# Patient Record
Sex: Female | Born: 1995 | Race: White | Hispanic: No | Marital: Married | Smoking: Never smoker
Health system: Southern US, Community
[De-identification: ages and names within clinical notes are randomized; demographics above are authoritative.]

## PROBLEM LIST (undated history)

## (undated) DIAGNOSIS — G43909 Migraine, unspecified, not intractable, without status migrainosus: Secondary | ICD-10-CM

## (undated) HISTORY — PX: INNER EAR SURGERY: SHX679

---

## 2015-01-25 ENCOUNTER — Emergency Department (HOSPITAL_BASED_OUTPATIENT_CLINIC_OR_DEPARTMENT_OTHER)
Admission: EM | Admit: 2015-01-25 | Discharge: 2015-01-25 | Disposition: A | Discharge status: Home / Self Care | Payer: Medicaid Other | Attending: Emergency Medicine | Admitting: Emergency Medicine

## 2015-01-25 ENCOUNTER — Encounter (HOSPITAL_BASED_OUTPATIENT_CLINIC_OR_DEPARTMENT_OTHER): Payer: Self-pay | Admitting: Emergency Medicine

## 2015-01-25 DIAGNOSIS — H9201 Otalgia, right ear: Secondary | ICD-10-CM | POA: Diagnosis present

## 2015-01-25 DIAGNOSIS — R51 Headache: Secondary | ICD-10-CM | POA: Insufficient documentation

## 2015-01-25 DIAGNOSIS — R519 Headache, unspecified: Secondary | ICD-10-CM

## 2015-01-25 MED ORDER — METHYLPREDNISOLONE SODIUM SUCC 125 MG IJ SOLR
125.0000 mg | Freq: Every day | INTRAMUSCULAR | Status: DC
  Administered 2015-01-25: 125 mg via INTRAVENOUS
  Filled 2015-01-25: qty 2

## 2015-01-25 MED ORDER — PROCHLORPERAZINE EDISYLATE 5 MG/ML IJ SOLN
10.0000 mg | Freq: Once | INTRAMUSCULAR | Status: AC
  Administered 2015-01-25: 10 mg via INTRAVENOUS
  Filled 2015-01-25: qty 2

## 2015-01-25 MED ORDER — PROCHLORPERAZINE MALEATE 10 MG PO TABS: 10.0000 mg | ORAL_TABLET | Freq: Two times a day (BID) | ORAL | Status: AC | PRN

## 2015-01-25 MED ORDER — DIPHENHYDRAMINE HCL 50 MG/ML IJ SOLN
25.0000 mg | Freq: Once | INTRAMUSCULAR | Status: AC
  Administered 2015-01-25: 25 mg via INTRAVENOUS
  Filled 2015-01-25: qty 1

## 2015-01-25 MED ORDER — MAGNESIUM SULFATE 2 GM/50ML IV SOLN
2.0000 g | Freq: Once | INTRAVENOUS | Status: AC
  Administered 2015-01-25: 2 g via INTRAVENOUS
  Filled 2015-01-25: qty 50

## 2015-01-25 MED ORDER — SODIUM CHLORIDE 0.9 % IV BOLUS (SEPSIS)
1000.0000 mL | Freq: Once | INTRAVENOUS | Status: AC
  Administered 2015-01-25: 1000 mL via INTRAVENOUS

## 2015-01-25 NOTE — ED Provider Notes (Signed)
CSN: [id number]     Arrival date & time 01/25/15  1500 History   First MD Initiated Contact with Patient 01/25/15 1552     Chief Complaint  Patient presents with  . Otalgia     (Consider location/radiation/quality/duration/timing/severity/associated sxs/prior Treatment) HPI   Blood pressure 113/61, pulse 69, temperature 98.4 F (36.9 C), temperature source Oral, resp. rate 18, height 5\' 4"  (1.626 m), weight 110 lb (49.896 kg), last menstrual period 01/08/2015, SpO2 100 %.  Leslie Short is a 19 y.o. female complaining of worsening right ear pain over the course of last several months. 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 She states that she has decreased hearing out of the ear and canal longer use a phone on the right side. She does also report an exacerbation of her chronic headaches with this, she had vertigo and a small amount of emesis this morning when the headache was very severe. She moved here and has not established ENT care. Patient denies fever, chills, change in vision.   History reviewed. No pertinent past medical history. Past Surgical History  Procedure Laterality Date  . Inner ear surgery Right    History reviewed. No pertinent family history. Social History  Substance Use Topics  . Smoking status: Never Smoker   . Smokeless tobacco: None  . Alcohol Use: No   OB History    No data available     Review of Systems  10 systems reviewed and found to be negative, except as noted in the HPI.  Allergies  Rocephin  Home Medications   Prior to Admission medications   Medication Sig Start Date End Date Taking? Authorizing Provider  prochlorperazine (COMPAZINE) 10 MG tablet Take 1 tablet (10 mg total) by mouth 2 (two) times daily as needed for nausea or vomiting (headache). 01/25/15   Trelon Plush, PA-C   BP 107/66 mmHg  Pulse 67  Temp(Src) 98.4 F (36.9 C) (Oral)  Resp  16  Ht 5\' 4"  (1.626 m)  Wt 110 lb (49.896 kg)  BMI 18.87 kg/m2  SpO2 100%  LMP 01/08/2015 (Approximate) Physical Exam  Constitutional: She is oriented to person, place, and time. She appears well-developed and well-nourished. No distress.  HENT:  Head: Normocephalic and atraumatic.  Mouth/Throat: Oropharynx is clear and moist.  Right outer ear canal normal, there does appear to be a small tympanic membrane rupture. No purulent discharge.  Eyes: Conjunctivae and EOM are normal. Pupils are equal, round, and reactive to light.  Neck: Normal range of motion.  Cardiovascular: Normal rate, regular rhythm and intact distal pulses.   Pulmonary/Chest: Effort normal and breath sounds normal.  Abdominal: Soft. There is no tenderness.  Musculoskeletal: Normal range of motion.  Neurological: She is alert and oriented to person, place, and time.  Skin: She is not diaphoretic.  Psychiatric: She has a normal mood and affect.  Nursing note and vitals reviewed.   ED Course  Procedures (including critical care time) Labs Review Labs Reviewed - No data to display  Imaging Review No results found. I have personally reviewed and evaluated these images and lab results as part of my medical decision-making.   EKG Interpretation None      MDM   Final diagnoses:  Otalgia, right  Nonintractable headache, unspecified chronicity pattern, unspecified headache type    Filed Vitals:   01/25/15 1507 01/25/15 1700 01/25/15 1730 01/25/15 1829  BP: 113/61  103/63 110/66 107/66  Pulse: 69 67 72 67  Temp: 98.4 F (36.9 C)     TempSrc: Oral     Resp: Height:  (1.626 m)     Weight: 110 lb (49.896 kg)     SpO2: 100% 99% 99% 100%    Medications  methylPREDNISolone sodium succinate (SOLU-MEDROL) 125 mg/2 mL injection 125 mg (125 mg Intravenous Given 01/25/15 1622)  sodium chloride 0.9 % bolus 1,000 mL (0 mLs Intravenous Stopped 01/25/15 1830)  prochlorperazine (COMPAZINE) injection 10  mg (10 mg Intravenous Given 01/25/15 1623)  diphenhydrAMINE (BENADRYL) injection 25 mg (25 mg Intravenous Given 01/25/15 1625)  magnesium sulfate IVPB 2 g 50 mL (0 g Intravenous Stopped 01/25/15 1716)    Leslie Short is a pleasant 19 y.o. female presenting with worsening right ear pain, believe that this is worsening her chronic headaches. Patient states that she felt dizzy and had nausea earlier in the day. We'll give her headache cocktail and reassess. I've explained to her that it critically important that she follow-up with ENT. She has a local ENT appointment I think with her history think it may be beneficial for her to follow at [facility], will give referral.   Reports improvement with headache cocktail, she is tolerating by mouth and able to ambulate without issue.  Evaluation does not show pathology that would require ongoing emergent intervention or inpatient treatment. Pt is hemodynamically stable and mentating appropriately. Discussed findings and plan with patient/guardian, who agrees with care plan. All questions answered. Return precautions discussed and outpatient follow up given.   Discharge Medication List as of 01/25/2015  6:24 PM    START taking these medications   Details  prochlorperazine (COMPAZINE) 10 MG tablet Take 1 tablet (10 mg total) by mouth 2 (two) times daily as needed for nausea or vomiting (headache)., Starting 01/25/2015, Until Discontinued, Print             Wynetta Emery, PA-C 01/25/15 1915  Geoffery Lyons, MD 01/25/15 [phone]

## 2015-01-25 NOTE — ED Notes (Signed)
Pt cont to be resting quietly, appears to be sleeping, safety measures in place

## 2015-01-25 NOTE — ED Notes (Signed)
Patient reports right ear pain x "a while ago".  Reports "around a month". Reports previously having 9 surgeries on this ear and states "its just really hurting lately".

## 2015-01-25 NOTE — ED Notes (Signed)
Presents with rt ear pain, states pain occurs frequently, this am pain present, had vertigo, unsteady gait, and nausea, sm amt of vomiting.

## 2015-01-25 NOTE — Discharge Instructions (Signed)
[facility] [phone]  Do not hesitate to return to the emergency room for any new, worsening or concerning symptoms.  Please obtain primary care using resource guide below. Let them know that you were seen in the emergency room and that they will need to obtain records for further outpatient management.    Emergency Department Resource Guide 1) Find a Doctor and Pay Out of Pocket Although you won't have to find out who is covered by your insurance plan, it is a good idea to ask around and get recommendations. You will then need to call the office and see if the doctor you have chosen will accept you as a new patient and what types of options they offer for patients who are self-pay. Some doctors offer discounts or will set up payment plans for their patients who do not have insurance, but you will need to ask so you aren't surprised when you get to your appointment.  2) Contact Your Local Health Department Not all health departments have doctors that can see patients for sick visits, but many do, so it is worth a call to see if yours does. If you don't know where your local health department is, you can check in your phone book. The CDC also has a tool to help you locate your state's health department, and many state websites also have listings of all of their local health departments.  3) Find a Walk-in Clinic If your illness is not likely to be very severe or complicated, you may want to try a walk in clinic. These are popping up all over the country in pharmacies, drugstores, and shopping centers. They're usually staffed by nurse practitioners or physician assistants that have been trained to treat common illnesses and complaints. They're usually fairly quick and inexpensive. However, if you have serious medical issues or chronic medical problems, these are probably not your best option.  No Primary Care Doctor: - Call Health Connect at  [phone] - they can help you  locate a primary care doctor that  accepts your insurance, provides certain services, etc. - Physician Referral Service- [phone]  Chronic Pain Problems: Organization         Address  Phone   Notes  [facility]  [phone] Patients need to be referred by their primary care doctor.   Medication Assistance: Organization         Address  Phone   Notes  [facility] Medication [facility] [address] [place] [place], [place] [address] [phone] --Must be a resident of [facility] -- Must have NO insurance coverage whatsoever (no Medicaid/ Medicare, etc.) -- The pt. MUST have a primary care doctor that directs their care regularly and follows them in the community   MedAssist  [phone]   [employer]  [phone]    Agencies that provide inexpensive medical care: Organization         Address  Phone   Notes  [facility]  [phone]   [facility]    [phone]   [facility] [address] [place], [place] [address] [phone]   [facility] [address] [place], [place] [phone]   Planned Parenthood    [phone]   [facility]    [phone]   Community Health and [facility]  [address], [place] Phone:  [phone], Fax:  [phone])  [phone] Hours of Operation:  9 am - 6 pm, M-F.  Also accepts Medicaid/Medicare and self-pay.  [facility] for [place], [address], [place] Phone: [phone], Fax: [phone]. Hours of Operation:  8:30 am - 5:30 pm, M-F.  Also accepts Medicaid and self-pay.  [facility] [place] [address], [place] Phone: [phone]   [place], [place], [place] [phone], Ext. [phone] Mondays & Thursdays: 7-9 AM.  First 15 patients are seen on a first come, first serve basis.    [place]  Providers:  Organization         Address  Phone   Notes  [facility] [address], [place] [phone] Also accepts self-pay patients.  [facility] [address] [place], [place]  [phone]   [place], [address], [place] [phone]   [facility] [address], [place] [phone]   Lucianne Lei [address], [place]   [phone] Only accepts [place] Access [place] patients after they have their name applied to their card.   Self-Pay (no insurance) in [facility]:  Organization         Address  Phone   Notes  Sickle Cell Patients, [facility] Internal Medicine [place] [phone]   [facility] [place] [phone]   [facility] [place]  [place], [place], [place] [phone]   [facility]/Dr. Osei-Bonsu  [address], [place] or [address], [place] [phone] Phone number for both [place] and [place] locations is the same.  Urgent Medical and [facility] [address] [place] [phone]   [facility] [address] [place] or [address] [phone]   [facility] [address], [place] [phone], phone; [phone], fax Sees patients 1st and 3rd Saturday of every month.  Must not qualify for public or private insurance (i.e. Medicaid, Medicare, [place] Health Choice, Veterans' Benefits)  Household income should be no more than 200% of the poverty level The clinic cannot treat you if you are pregnant or think you are pregnant  Sexually transmitted diseases are not treated at the clinic.    Dental Care: Organization         Address  Phone  Notes  [facility] [phone] Accepts children up to age 33 who are enrolled in [place] or [place]; pregnant women with a Medicaid card; and children who have applied for Medicaid or [place] Health Choice, but were declined, whose parents can pay a reduced fee at time of service.  [facility] Department of [facility]  [address], [place] [phone] Accepts children up to age 77 who are enrolled in [place] or [place]; pregnant women with a Medicaid card; and children who have applied for Medicaid or  Health Choice, but were declined, whose parents can pay a reduced fee at time of service.  [facility]  [place] [phone] Patients are seen by appointment only. Walk-ins are not accepted. [place]  will see patients 19 years of age and older. Monday - Tuesday (8am-5pm) Most Wednesdays (8:30-5pm) $30 per visit, cash only  [facility]  [address], [facility] [phone] Patients are seen by appointment only. Walk-ins are not accepted. [facility] will see patients 818 years of age and older. One Wednesday Evening (Monthly: Volunteer Based).  $30 per visit, cash only  [employer] [facility] [facility]  [phone] for adults; Children under age 524, call Graduate Pediatric Dentistry at [phone]. Children aged 94-14, please call [phone] to request a pediatric application.  Dental services are provided in all areas of dental care including fillings, crowns and bridges, complete and partial dentures, implants, gum treatment, root canals, and extractions. Preventive care is also provided. Treatment is provided to both adults and children. Patients are selected via a lottery and there is often a waiting list.   [facility] [address], [place]  [phone] [email or web address]   [facility]  [address], [place], [place] [phone], Ext. [phone] Second and Fourth Thursday of each month, opens at 6:30 AM; Clinic ends at 9 AM.  Patients are seen on a first-come first-served basis, and a limited number are seen during each clinic.   [facility]  [address], [place], [place] [phone]   Eligibility Requirements You must have lived in [place], [place], or [place] counties for at least the last three months.   You cannot be eligible for state or federal sponsored [place]healthcare insurance, including CIGNAVeterans Administration, [place]icaid, or [employer]icare.   You generally cannot be eligible for healthcare insurance through your employer.    How to apply: Eligibility screenings are held every Tuesday and Wednesday afternoon from 1:00 pm until 4:00 pm. You do not need an appointment for the interview!  [facility] [address], [place], [place] [phone]   [facility]  [phone]   [facility]  [phone]   [facility]  [phone]    Behavioral Health Resources in the Community: Intensive Outpatient Programs Organization         Address  Phone  Notes  [facility] [address], [place], [place] [phone]   [facility]patient [address], [place], [place] [phone]   [facility] [address], [place], [place]  [phone]   [facility] [address],  [place], [place] [phone] or [phone]   Substance Abuse Resources Organization         Address  Phone  Notes  [facility] Drug Services  [phone]   [facility]  [phone]   [facility]  [phone]   [facility]  [phone]   Residential & Outpatient Substance Abuse Program  [phone]   Psychological Services Organization         Address  Phone  Notes  [facility]  [phone]- [phone]     [facility]  [phone]- [phone]   [facility] [address], [place] [phone] or [phone]    Mobile Crisis Teams Organization         Address  Phone  Notes  [facility], Mobile Crisis Care Unit  [phone]   [facility]  [address] [place], [place] [phone]   [facility] [address] [place] [phone]    Self-Help/Support Groups Organization  Address  Phone             Notes  [place]. of [place] - variety of support groups  [place] Call for more information  Narcotics Anonymous (NA), [facility] [address], [employer] [place]  2 meetings at this location   Special educational needs teacher         Address  Phone  Notes  ASAP Residential Treatment [place],    [place]  [phone]   [facility]  [address], [place] 329924, [place], [place]   [place], [place] [phone] Admissions: 8am-3pm M-F  Incentives Substance [place] [address],    [place], [place] [phone]   [facility] [address] [place], [place], [place]   [facility] [address],  [place], [place]   [facility] - Intensive Outpatient [address], [place], [place]   [facility].,  [place], [place] [phone] or [phone]   [facility] Services (RTS) [address] [place], [place] Accepts Medicaid  [facility] [address]  [place] [phone] Substance Abuse/Addiction Treatment   [facility] Organization         Address  Phone  Notes  [facility]  [phone]   Domenic Schwab, PhD [address] Arlis [place], [place]   [phone] or [phone]    [place], [place] [phone]   [facility] [address], [place], [place] [phone] Insurance/Medicaid/sponsorship through [facility] and Families [address], [address]                                    [place], [place] [phone] [place] [address], [place] [phone]    Dr. Adele Schilder  [phone]   [facility] Dept. 1) [address], [place] 2) [place] 3)  [place] [address], [place] [phone]  [phone]   [place] [phone] or [phone] (After Hours)

## 2015-01-28 ENCOUNTER — Telehealth (HOSPITAL_BASED_OUTPATIENT_CLINIC_OR_DEPARTMENT_OTHER): Payer: Self-pay | Admitting: Emergency Medicine

## 2015-01-29 ENCOUNTER — Emergency Department (HOSPITAL_COMMUNITY): Payer: Medicaid Other

## 2015-01-29 ENCOUNTER — Emergency Department (HOSPITAL_BASED_OUTPATIENT_CLINIC_OR_DEPARTMENT_OTHER)
Admission: EM | Admit: 2015-01-29 | Discharge: 2015-01-29 | Disposition: A | Discharge status: Home / Self Care | Payer: Medicaid Other | Attending: Emergency Medicine | Admitting: Emergency Medicine

## 2015-01-29 ENCOUNTER — Encounter (HOSPITAL_BASED_OUTPATIENT_CLINIC_OR_DEPARTMENT_OTHER): Payer: Self-pay | Admitting: *Deleted

## 2015-01-29 DIAGNOSIS — R279 Unspecified lack of coordination: Secondary | ICD-10-CM

## 2015-01-29 DIAGNOSIS — R42 Dizziness and giddiness: Secondary | ICD-10-CM

## 2015-01-29 DIAGNOSIS — H9202 Otalgia, left ear: Secondary | ICD-10-CM

## 2015-01-29 LAB — BASIC METABOLIC PANEL
Anion gap: 9 (ref 5–15)
BUN: 14 mg/dL (ref 6–20)
CALCIUM: 9.7 mg/dL (ref 8.9–10.3)
CO2: 27 mmol/L (ref 22–32)
CREATININE: 0.72 mg/dL (ref 0.44–1.00)
Chloride: 104 mmol/L (ref 101–111)
GFR calc non Af Amer: >60 mL/min (ref >60)
Glucose, Bld: 93 mg/dL (ref 65–99)
Potassium: 3.8 mmol/L (ref 3.5–5.1)
SODIUM: 140 mmol/L (ref 135–145)

## 2015-01-29 LAB — CBC WITH DIFFERENTIAL/PLATELET
BASOS PCT: 0 % (ref 0–1)
Basophils Absolute: 0 10*3/uL (ref 0.0–0.1)
EOS ABS: 0 10*3/uL (ref 0.0–0.7)
EOS PCT: 0 % (ref 0–5)
HCT: 38 % (ref 36.0–46.0)
HEMOGLOBIN: 13.1 g/dL (ref 12.0–15.0)
Lymphocytes Relative: 35 % (ref 12–46)
Lymphs Abs: 1.8 10*3/uL (ref 0.7–4.0)
MCH: 30.4 pg (ref 26.0–34.0)
MCHC: 34.5 g/dL (ref 30.0–36.0)
MCV: 88.2 fL (ref 78.0–100.0)
MONOS PCT: 13 % — AB (ref 3–12)
Monocytes Absolute: 0.7 10*3/uL (ref 0.1–1.0)
NEUTROS PCT: 52 % (ref 43–77)
Neutro Abs: 2.8 10*3/uL (ref 1.7–7.7)
PLATELETS: 270 10*3/uL (ref 150–400)
RBC: 4.31 MIL/uL (ref 3.87–5.11)
RDW: 12.4 % (ref 11.5–15.5)
WBC: 5.3 10*3/uL (ref 4.0–10.5)

## 2015-01-29 LAB — PREGNANCY, URINE: Preg Test, Ur: NEGATIVE

## 2015-01-29 MED ORDER — PROCHLORPERAZINE EDISYLATE 5 MG/ML IJ SOLN: 10.0000 mg | Freq: Once | INTRAMUSCULAR | Status: DC

## 2015-01-29 MED ORDER — BUTALBITAL-APAP-CAFFEINE 50-325-40 MG PO TABS
2.0000 | ORAL_TABLET | Freq: Once | ORAL | Status: DC
  Filled 2015-01-29: qty 2

## 2015-01-29 MED ORDER — SODIUM CHLORIDE 0.9 % IV BOLUS (SEPSIS): 1000.0000 mL | Freq: Once | INTRAVENOUS | Status: DC

## 2015-01-29 MED ORDER — METOCLOPRAMIDE HCL 5 MG/ML IJ SOLN
10.0000 mg | Freq: Once | INTRAMUSCULAR | Status: DC
  Filled 2015-01-29: qty 2

## 2015-01-29 MED ORDER — GADOBENATE DIMEGLUMINE 529 MG/ML IV SOLN
10.0000 mL | Freq: Once | INTRAVENOUS | Status: AC | PRN
  Administered 2015-01-29: 10 mL via INTRAVENOUS

## 2015-01-29 MED ORDER — IBUPROFEN 400 MG PO TABS
600.0000 mg | ORAL_TABLET | Freq: Once | ORAL | Status: AC
  Administered 2015-01-29: 600 mg via ORAL
  Filled 2015-01-29 (×2): qty 1

## 2015-01-29 MED ORDER — DIPHENHYDRAMINE HCL 50 MG/ML IJ SOLN
25.0000 mg | Freq: Once | INTRAMUSCULAR | Status: DC
  Filled 2015-01-29: qty 1

## 2015-01-29 MED ORDER — MAGNESIUM SULFATE 2 GM/50ML IV SOLN
2.0000 g | Freq: Once | INTRAVENOUS | Status: DC
  Filled 2015-01-29: qty 50

## 2015-01-29 NOTE — ED Provider Notes (Signed)
CSN: [id number]     Arrival date & time 01/29/15  1359 History   First MD Initiated Contact with Patient 01/29/15 1434     Chief Complaint  Patient presents with  . Ear Problem     (Consider location/radiation/quality/duration/timing/severity/associated sxs/prior Treatment) HPI   Blood pressure 139/86, pulse 90, temperature 98.9 F (37.2 C), temperature source Oral, resp. rate 18, height  (1.626 m), weight 110 lb (49.896 kg), last menstrual period 01/08/2015, SpO2 100 %.  Leslie Short is a 19 y.o. female complaining of visual disturbance onset this morning. Patient states that she could not use her phone appropriately, she was trying to text and the buttons and she thought she was pressing them but the film was not responding appropriately. Patient also reports a numbness to the left second and third digits which have resolved. Patient reports exacerbation of her chronic headache with decreased hearing acuity out of the right as well. 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 Patient was seen for headache exacerbation one week ago and given referral to [facility]NT she has an appointment set up. Patient is concerned that the tumor is recurring. She denies ataxia, dysarthria.   History reviewed. No pertinent past medical history. Past Surgical History  Procedure Laterality Date  . Inner ear surgery Right    No family history on file. Social History  Substance Use Topics  . Smoking status: Never Smoker   . Smokeless tobacco: None  . Alcohol Use: No   OB History    No data available     Review of Systems  10 systems reviewed and found to be negative, except as noted in the HPI.   Allergies  Rocephin  Home Medications   Prior to Admission medications   Medication Sig Start Date End Date Taking? Authorizing Provider  prochlorperazine (COMPAZINE) 10 MG tablet Take 1  tablet (10 mg total) by mouth 2 (two) times daily as needed for nausea or vomiting (headache). 01/25/15   Marveen Donlon, PA-C   BP 139/86 mmHg  Pulse 90  Temp(Src) 98.9 F (37.2 C) (Oral)  Resp 18  Ht  (1.626 m)  Wt 110 lb (49.896 kg)  BMI 18.87 kg/m2  SpO2 100%  LMP 01/08/2015 (Approximate) Physical Exam  Constitutional: She is oriented to person, place, and time. She appears well-developed and well-nourished. No distress.  HENT:  Head: Normocephalic and atraumatic.  Mouth/Throat: Oropharynx is clear and moist.  Right tympanic membrane with abnormal architecture, patient baseline unknown  Eyes: Conjunctivae and EOM are normal. Pupils are equal, round, and reactive to light.  Neck: Normal range of motion.  Cardiovascular: Normal rate, regular rhythm and intact distal pulses.   Pulmonary/Chest: Effort normal and breath sounds normal. No stridor.  Abdominal: Soft. There is no tenderness.  Musculoskeletal: Normal range of motion.  Neurological: She is alert and oriented to person, place, and time.  Follows commands, Clear, goal oriented speech, Strength is 5 out of 5x4 extremities, patient ambulates with a coordinated in nonantalgic gait. Sensation is grossly intact.   Skin: She is not diaphoretic.  Psychiatric: She has a normal mood and affect.  Nursing note and vitals reviewed.   ED Course  Procedures (including critical care time) Labs Review Labs Reviewed - No data to display  Imaging Review No results found. I have personally reviewed and evaluated these images and lab results as part of my  medical decision-making.   EKG Interpretation None      MDM   Final diagnoses:  Coordination problem    Filed Vitals:   01/29/15 1412  BP: 139/86  Pulse: 90  Temp: 98.9 F (37.2 C)  TempSrc: Oral  Resp: 18  Height: 5\' 4"  (1.626 m)  Weight: 110 lb (49.896 kg)  SpO2: 100%    Medications  ibuprofen (ADVIL,MOTRIN) tablet 600 mg (600 mg Oral Given 01/29/15 1526)     Leslie Short is a pleasant 19 y.o. female presenting with past medical history of right-sided remote cholesteatoma, patient states that she was trying to text on her phone but could not coordinate earlier in the day. This is resolved. She also reports a numbness to the left second and third finger which are also resolved. My neuro exam is nonfocal. This patient warrants an MRI.   Case discussed with Pricilla Loveless at [facility]. Patient will transfer by private vehicle there. Patient's urine pregnancy and blood work will be drawn so that she can expedite MRI with and without contrast. Patient will follow with ENT at [facility].  This is a shared visit with the attending physician who personally evaluated the patient and agrees with the care plan.     Wynetta Emery, PA-C 01/29/15 1547  Tilden Fossa, MD 01/29/15 [phone]

## 2015-01-29 NOTE — ED Notes (Signed)
Pt A&Ox4, ambulatory at d/c with steady gait, NAD 

## 2015-01-29 NOTE — ED Notes (Signed)
Pt arrived here by POV - sent here from [facility] for MRI brain per Dr. Denton Lank.

## 2015-01-29 NOTE — ED Notes (Signed)
Patient is going to [facility] ED by POV

## 2015-01-29 NOTE — Discharge Instructions (Signed)

## 2015-01-29 NOTE — ED Notes (Signed)
PA at bedside.

## 2015-01-29 NOTE — ED Notes (Signed)
Rees MD at bedside  

## 2015-01-29 NOTE — ED Notes (Signed)
Pt directed to go directly to [facility] ED and to remain NPO. Directions given to [facility] ED. Transfer paperwork given to pt to present to registration

## 2015-01-29 NOTE — ED Provider Notes (Signed)
Results for orders placed or performed during the hospital encounter of 01/29/15  Pregnancy, urine  Result Value Ref Range   Preg Test, Ur NEGATIVE NEGATIVE  Basic metabolic panel  Result Value Ref Range   Sodium 140 135 - 145 mmol/L   Potassium 3.8 3.5 - 5.1 mmol/L   Chloride 104 101 - 111 mmol/L   CO2 27 22 - 32 mmol/L   Glucose, Bld 93 65 - 99 mg/dL   BUN 14 6 - 20 mg/dL   Creatinine, Ser 8.11 0.44 - 1.00 mg/dL   Calcium 9.7 8.9 - 91.4 mg/dL   GFR calc non Af Amer >60 >60 mL/min   GFR calc Af Amer >60 >60 mL/min   Anion gap 9 5 - 15  CBC with Differential  Result Value Ref Range   WBC 5.3 4.0 - 10.5 K/uL   RBC 4.31 3.87 - 5.11 MIL/uL   Hemoglobin 13.1 12.0 - 15.0 g/dL   HCT 78.2 95.6 - 21.3 %   MCV 88.2 78.0 - 100.0 fL   MCH 30.4 26.0 - 34.0 pg   MCHC 34.5 30.0 - 36.0 g/dL   RDW 08.6 57.8 - 46.9 %   Platelets 270 150 - 400 K/uL   Neutrophils Relative % 52 43 - 77 %   Neutro Abs 2.8 1.7 - 7.7 K/uL   Lymphocytes Relative 35 12 - 46 %   Lymphs Abs 1.8 0.7 - 4.0 K/uL   Monocytes Relative 13 (H) 3 - 12 %   Monocytes Absolute 0.7 0.1 - 1.0 K/uL   Eosinophils Relative 0 0 - 5 %   Eosinophils Absolute 0.0 0.0 - 0.7 K/uL   Basophils Relative 0 0 - 1 %   Basophils Absolute 0.0 0.0 - 0.1 K/uL   Mr Leslie Short Contrast  01/29/2015   CLINICAL DATA:  19 year old female with visual disturbances. Left extremity numbness which has resolved. Exacerbation of chronic headaches and decreased hearing acuity. Patient reports previous IAC surgery. Initial encounter.  EXAM: MRI HEAD WITHOUT AND WITH CONTRAST  TECHNIQUE: Multiplanar, multiecho pulse sequences of the brain and surrounding structures were obtained without and with intravenous contrast.  CONTRAST:  10mL MULTIHANCE GADOBENATE DIMEGLUMINE 529 MG/ML IV SOLN  COMPARISON:  None.  FINDINGS: Cerebral volume is normal. No restricted diffusion to suggest acute infarction. No midline shift, mass effect, evidence of mass lesion, ventriculomegaly,  extra-axial collection or acute intracranial hemorrhage. Cervicomedullary junction and pituitary are within normal limits.  No postoperative changes are evident about the temporal bones. The mastoids appear present in clear. Dedicated thin slice internal auditory canal imaging. Normal cerebellopontine angles. Normal bilateral cisternal and intracanalicular 7th and 8th cranial nerves segments. T2 signal appears preserved in the bilateral cochlea and vestibular structures. The No abnormal enhancement identified. Normal stylomastoid foramina. Visualized parotid glands appear normal.  No encephalomalacia identified. Wallace Cullens and white matter signal is within normal limits throughout the brain. No abnormal enhancement identified. No dural thickening identified. Negative visualized cervical spine. Visualized bone marrow signal is within normal limits. Major intracranial vascular flow voids are within normal limits.  Orbit and scalp soft tissues appear normal. Paranasal sinuses are clear.  IMPRESSION: 1. IAC imaging appears normal. 2. No acute intracranial abnormality. Normal MRI appearance of the brain.   Electronically Signed   By: [place]essa Fleming M.D.   On: 01/29/2015 19:02   Pt seen and sent here for MRI.  Mri normal per radiologist.   Pt's symptoms may be related to migraines, possible change in  migraine pattern.  Pt advised to follow up  Her ENT as scheduled.   Lonia Skinner [place], PA-C 01/29/15 1939  Raeford Razor, MD 02/05/15 [phone]

## 2015-01-29 NOTE — ED Notes (Signed)
Here with visual disturbances, brief period of numbness in her left fingers followed by a headache. 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  She is concerned the tumor has returned.

## 2016-03-27 IMAGING — MR MR HEAD WO/W CM
12 of 14 series · 32 of 48 positions shown · IV contrast (multihance)
Comparison: None.

CLINICAL DATA: 19-year-old female with visual disturbances. Left
extremity numbness which has resolved. Exacerbation of chronic
headaches and decreased hearing acuity. Patient reports previous IAC
surgery. Initial encounter.

EXAM:
MRI HEAD WITHOUT AND WITH CONTRAST
TECHNIQUE: Multiplanar, multiecho pulse sequences of the brain and surrounding
structures were obtained without and with intravenous contrast.
CONTRAST:  10mL MULTIHANCE GADOBENATE DIMEGLUMINE 529 MG/ML IV SOLN

[Series 3: DWI · axial · 3.0mm · 0.94mm/px · z∈[-90,+44]mm · 8 of 94 slices shown (1 of 4)]
[im 1/94]
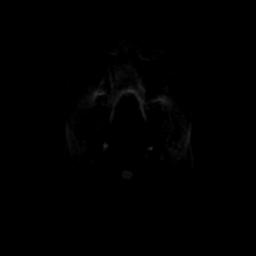
[im 14/94]
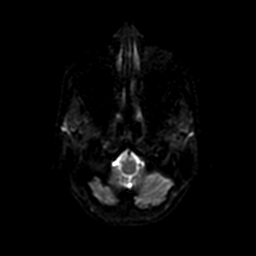
[im 27/94]
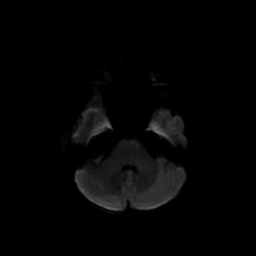
[im 40/94]
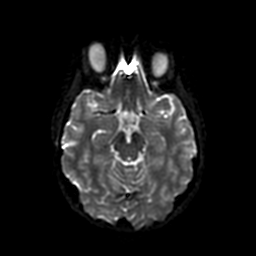
[im 54/94]
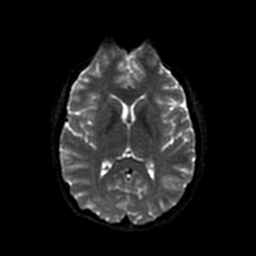
[im 67/94]
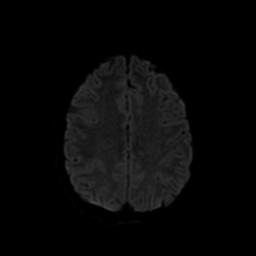
[im 80/94]
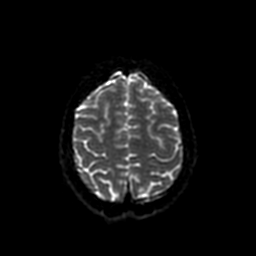
[im 94/94]
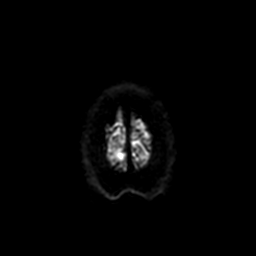

[Series 5: DWI · coronal · 5.0mm · 0.94mm/px · 6 of 66 slices shown (2 of 4)]
[im 1/66]
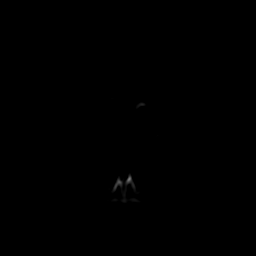
[im 14/66]
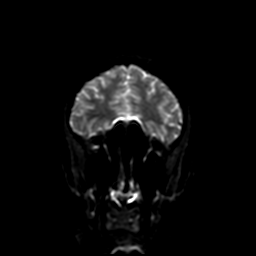
[im 27/66]
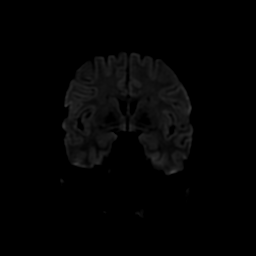
[im 40/66]
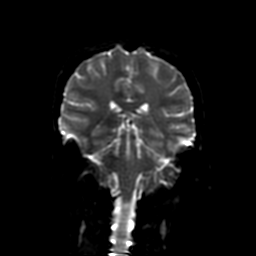
[im 53/66]
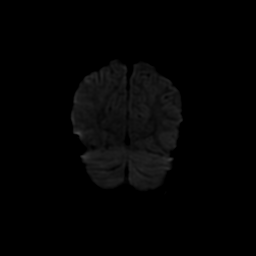
[im 66/66]
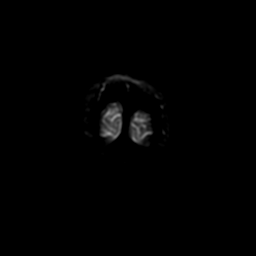

[Series 6: FLAIR · sagittal · 5.0mm · 0.47mm/px · 2 of 23 slices shown (1 of 2)]
[im 1/23]
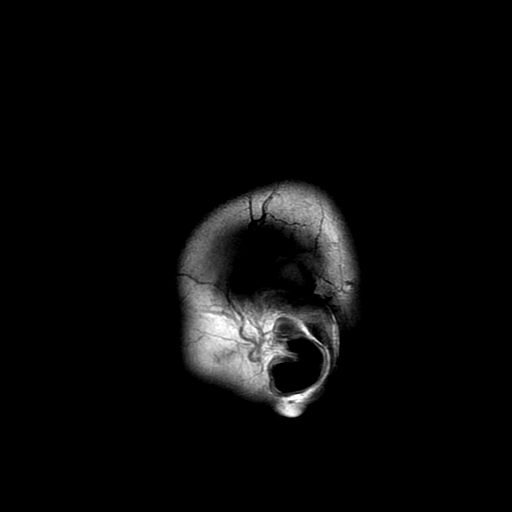
[im 23/23]
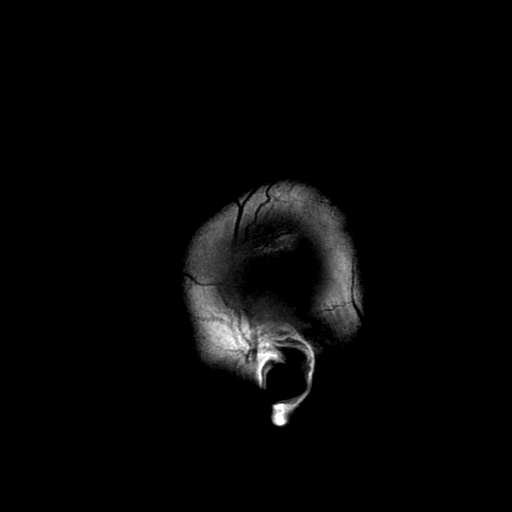

[Series 7: T2 · axial · 5.0mm · 0.43mm/px · z∈[-88,+46]mm · 2 of 24 slices shown]
[im 1/24]
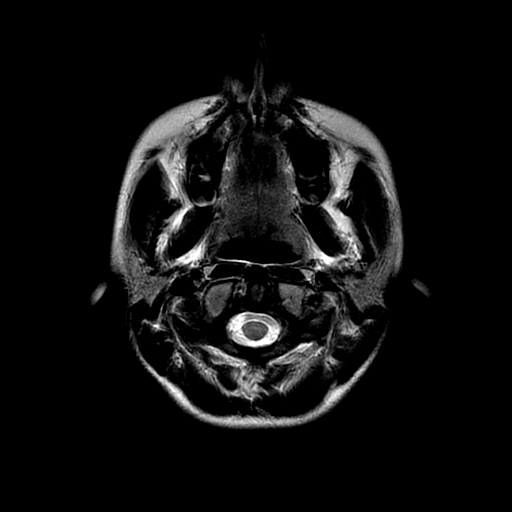
[im 24/24]
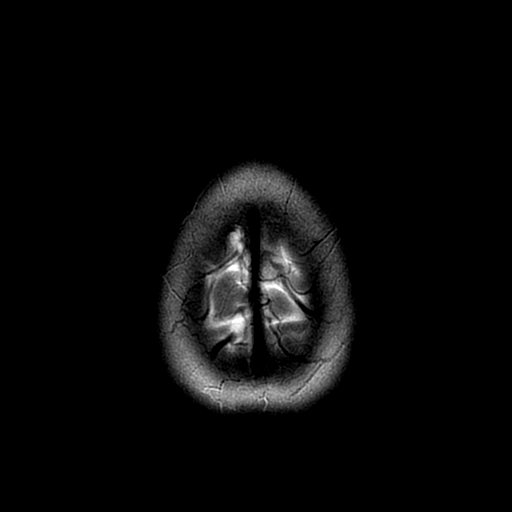

[Series 8: FLAIR · axial · 5.0mm · 0.43mm/px · z∈[-88,+46]mm · 2 of 24 slices shown (2 of 2)]
[im 1/24]
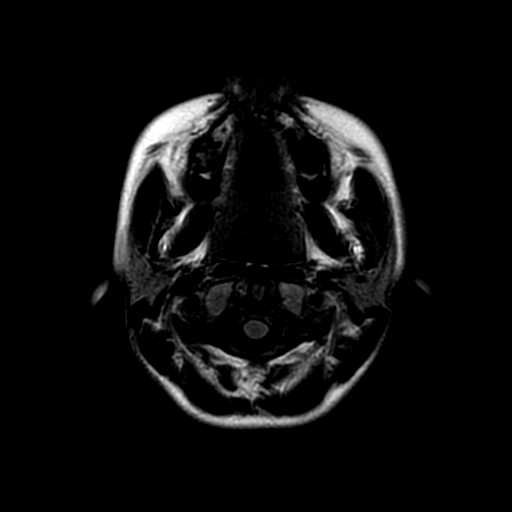
[im 24/24]
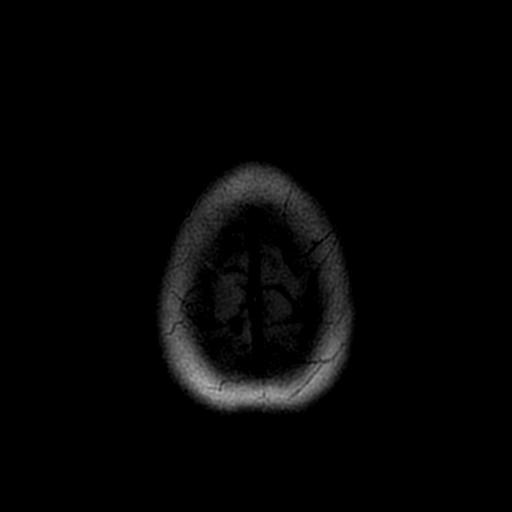

[Series 10: T1 · axial · 3.0mm · 0.39mm/px · 1 of 19 slices shown (1 of 5)]
[im 1/19]
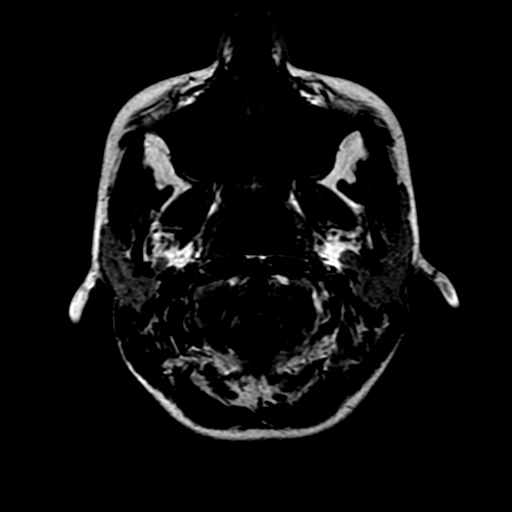

[Series 11: T1 · coronal · 3.0mm · 0.35mm/px · 1 of 19 slices shown (2 of 5)]
[im 1/19]
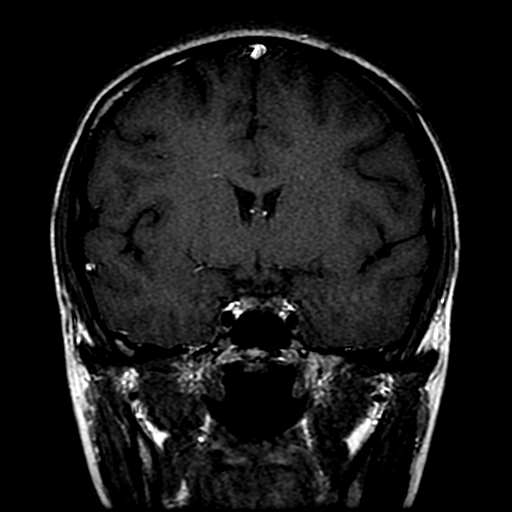

[Series 12: T1 · axial · 3.0mm · 0.39mm/px · 1 of 19 slices shown (3 of 5)]
[im 1/19]
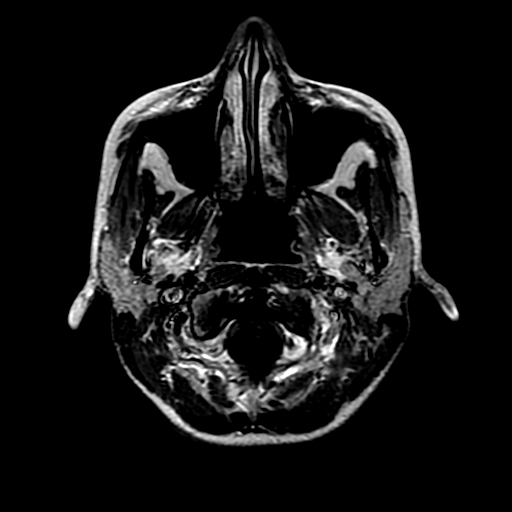

[Series 13: T1 · coronal · 3.0mm · 0.35mm/px · 1 of 19 slices shown (4 of 5)]
[im 1/19]
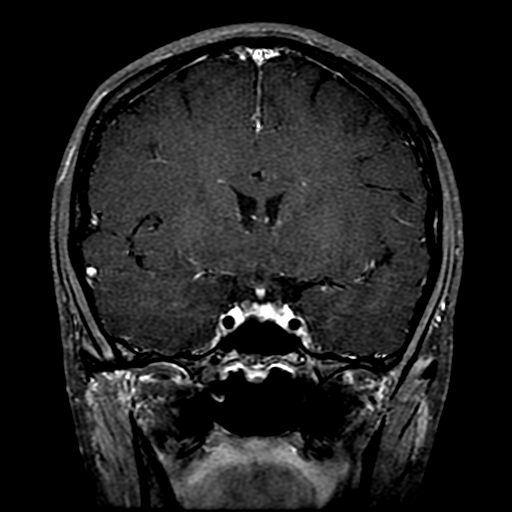

[Series 15: T1 · coronal · 5.0mm · 0.43mm/px · 2 of 28 slices shown (5 of 5)]
[im 1/28]
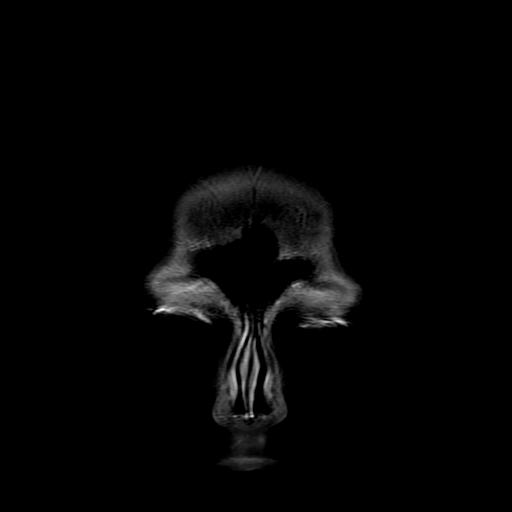
[im 28/28]
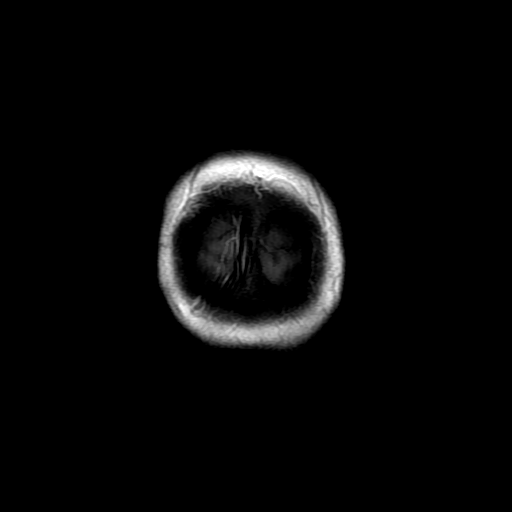

[Series 300: DWI · axial · 3.0mm · 0.94mm/px · z∈[-90,+44]mm · 4 of 47 slices shown (3 of 4)]
[im 1/47]
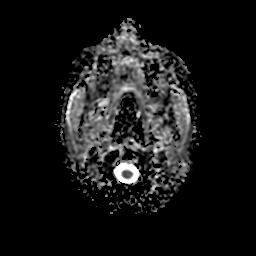
[im 16/47]
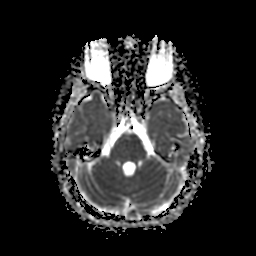
[im 31/47]
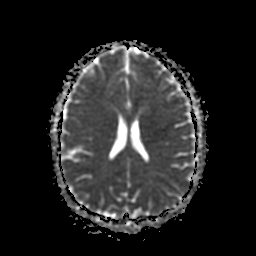
[im 47/47]
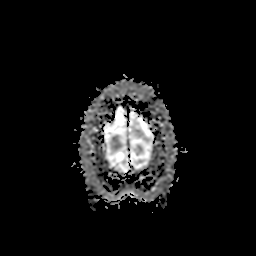

[Series 500: DWI · coronal · 5.0mm · 0.94mm/px · 2 of 32 slices shown (4 of 4)]
[im 1/32]
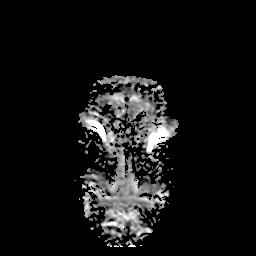
[im 32/32]
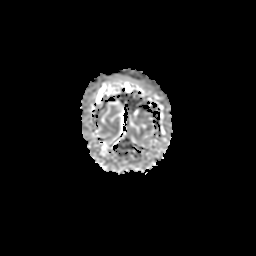

[32 of 48 positions shown; findings below may reference images not displayed]

FINDINGS: Cerebral volume is normal. No restricted diffusion to suggest acute
infarction. No midline shift, mass effect, evidence of mass lesion,
ventriculomegaly, extra-axial collection or acute intracranial
hemorrhage. Cervicomedullary junction and pituitary are within
normal limits.

No postoperative changes are evident about the temporal bones. The
mastoids appear present in clear. Dedicated thin slice internal
auditory canal imaging. Normal cerebellopontine angles. Normal
bilateral cisternal and intracanalicular 7th and 8th cranial nerves
segments. T2 signal appears preserved in the bilateral cochlea and
vestibular structures. The No abnormal enhancement identified.
Normal stylomastoid foramina. Visualized parotid glands appear
normal.

No encephalomalacia identified. Gray and white matter signal is
within normal limits throughout the brain. No abnormal enhancement
identified. No dural thickening identified. Negative visualized
cervical spine. Visualized bone marrow signal is within normal
limits. Major intracranial vascular flow voids are within normal
limits.

Orbit and scalp soft tissues appear normal. Paranasal sinuses are
clear.
IMPRESSION: 1. IAC imaging appears normal.
2. No acute intracranial abnormality. Normal MRI appearance of the
brain.

## 2016-08-26 ENCOUNTER — Encounter (HOSPITAL_BASED_OUTPATIENT_CLINIC_OR_DEPARTMENT_OTHER): Payer: Self-pay | Admitting: Emergency Medicine

## 2016-08-26 ENCOUNTER — Emergency Department (HOSPITAL_BASED_OUTPATIENT_CLINIC_OR_DEPARTMENT_OTHER)
Admission: EM | Admit: 2016-08-26 | Discharge: 2016-08-26 | Disposition: A | Discharge status: Home / Self Care | Payer: BLUE CROSS/BLUE SHIELD | Attending: Emergency Medicine | Admitting: Emergency Medicine

## 2016-08-26 DIAGNOSIS — G510 Bell's palsy: Secondary | ICD-10-CM

## 2016-08-26 DIAGNOSIS — R51 Headache: Secondary | ICD-10-CM | POA: Diagnosis present

## 2016-08-26 HISTORY — DX: Migraine, unspecified, not intractable, without status migrainosus: G43.909

## 2016-08-26 MED ORDER — VALACYCLOVIR HCL 1 G PO TABS: 1000.0000 mg | ORAL_TABLET | Freq: Three times a day (TID) | ORAL | 0 refills | Status: AC

## 2016-08-26 MED ORDER — PREDNISONE 50 MG PO TABS
60.0000 mg | ORAL_TABLET | Freq: Once | ORAL | Status: AC
  Administered 2016-08-26: 60 mg via ORAL
  Filled 2016-08-26: qty 1

## 2016-08-26 MED ORDER — PREDNISONE 10 MG PO TABS: 30.0000 mg | ORAL_TABLET | Freq: Two times a day (BID) | ORAL | 0 refills | Status: AC

## 2016-08-26 MED ORDER — PREDNISONE 10 MG (21) PO TBPK: ORAL_TABLET | ORAL | 0 refills | Status: AC

## 2016-08-26 MED ORDER — VALACYCLOVIR HCL 500 MG PO TABS
1000.0000 mg | ORAL_TABLET | Freq: Once | ORAL | Status: AC
  Administered 2016-08-26: 1000 mg via ORAL
  Filled 2016-08-26: qty 2

## 2016-08-26 NOTE — ED Triage Notes (Signed)
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 The patient reports that she is having some trouble with her left eye opening and closing, today this am she noticed that her face is drooping.

## 2016-08-26 NOTE — Discharge Instructions (Signed)
Your symptoms are consistent with Bell's Palsy. This condition is typically temporary, but can leave permanent effects. Please do the following:  Prednisone: Take 30 mg (3 tablets) twice daily for the first four days. Starting on day 5, begin taking the tapered dose pack, following the included instructions. You will be taking this medication for a total of 10 days. Valacyclovir: Take this medication 3 times a day for the next 7 days. Eye dryness: Should your affected eye start to become dry, begin using artificial tears drops. These are available from a variety of manufacturers over-the-counter. Administer these in the affected eye every hour while awake. Follow-up: Follow-up with your neurologist as soon as possible on this matter as well as for continued management. Return: Should symptoms worsen, [facility].

## 2016-08-26 NOTE — ED Provider Notes (Signed)
[facility] Provider Note   CSN: [id number] Arrival date & time: 08/26/16  1955  By signing my name below, I, Diona BrownerJennifer Gorman, attest that this documentation has been prepared under the direction and in the presence of Deryck Hippler, PA-C.  Electronically Signed: Diona BrownerJennifer Gorman, ED Scribe. 08/26/16. 8:58 PM.   History   Chief Complaint Chief Complaint  Patient presents with  . Headache    HPI Leslie Short is a 21 y.o. female with a PMHx of severe migraines who presents to the Emergency Department complaining of sudden left facial drooping that started "sometime today," but was noted at around 6:00pm. Her smile was normal at around 8 AM this morning when she was getting ready for the day. She noticed while eating breakfast this morning that the left side of her mouth was weaker. After work she noticed the left side of her face was drooping. She notes that this has never happened before. She also notes that she has had some drooping of her left eyelid for the past 4 days and she thought it had to do with her migraines. She states that she has complex migraines for which she is followed by a neurologist. These migraines tend to produce neurologic symptoms such as eye lid ptosis or extremity numbness or tingling. These types of symptoms usually proceed her headache by a few days.  Pt denies any visual disturbances, dizziness, fever/chills, confusion, hearing loss, or any other complaints. Denies illicit drug use, hormone therapy, clotting disorders, DVT/PE/CVA/TIA, and any recent traumas.  Patient states she is currently breast-feeding and her infant is 656 months old.    HPI  Past Medical History:  Diagnosis Date  . Migraine     There are no active problems to display for this patient.   Past Surgical History:  Procedure Laterality Date  . INNER EAR SURGERY Right     OB History    No data available       Home Medications    Prior to Admission medications     Medication Sig Start Date End Date Taking? Authorizing Provider  predniSONE (DELTASONE) 10 MG tablet Take 3 tablets (30 mg total) by mouth 2 (two) times daily with a meal. 08/26/16 08/30/16  Evin Chirco C Ragina Fenter, PA-C  predniSONE (STERAPRED UNI-PAK 21 TAB) 10 MG (21) TBPK tablet Beginning on day 5, Take 6 tabs on day 5, 5 tabs on day 6, 4 tabs on day 7, 3 tabs on day 8, 2 tabs on day 9, and 1 tab on day 10. 08/26/16   Angelisse Riso C Haliegh Khurana, PA-C  prochlorperazine (COMPAZINE) 10 MG tablet Take 1 tablet (10 mg total) by mouth 2 (two) times daily as needed for nausea or vomiting (headache). 01/25/15   Nicole Pisciotta, PA-C  valACYclovir (VALTREX) 1000 MG tablet Take 1 tablet (1,000 mg total) by mouth 3 (three) times daily. 08/26/16   Anselm PancoastShawn C Aerilyn Slee, PA-C    Family History History reviewed. No pertinent family history.  Social History Social History  Substance Use Topics  . Smoking status: Never Smoker  . Smokeless tobacco: Never Used  . Alcohol use No     Allergies   Rocephin [ceftriaxone]   Review of Systems Review of Systems  Constitutional: Negative for fever.  HENT: Negative for drooling, facial swelling and trouble swallowing.   Eyes: Negative for visual disturbance.  Respiratory: Negative for shortness of breath.   Gastrointestinal: Negative for nausea and vomiting.  Musculoskeletal: Negative for back pain, neck pain and neck stiffness.  Skin: Negative for rash.  Neurological: Positive for facial asymmetry. Negative for dizziness, seizures, syncope, weakness, light-headedness, numbness and headaches.       Left eye lid ptosis.  All other systems reviewed and are negative.    Physical Exam Updated Vital Signs BP (!) 126/92 (BP Location: Left Arm)   Pulse 77   Temp 98.4 F (36.9 C) (Oral)   Resp 18   Ht 5\' 5"  (1.651 m)   Wt 125 lb (56.7 kg)   LMP 08/25/2016   SpO2 100%   BMI 20.80 kg/m   Physical Exam  Constitutional: She is oriented to person, place, and time. She appears  well-developed and well-nourished. No distress.  HENT:  Head: Normocephalic and atraumatic.  Right Ear: External ear and ear canal normal.  Left Ear: External ear and ear canal normal.  Mouth/Throat: Uvula is midline, oropharynx is clear and moist and mucous membranes are normal. No trismus in the jaw.  Patient is able to handle oral secretions without difficulty. No trismus.  Eyes: Conjunctivae and EOM are normal. Pupils are equal, round, and reactive to light.  Neck: Normal range of motion. Neck supple.  Cardiovascular: Normal rate, regular rhythm, normal heart sounds and intact distal pulses.   Pulmonary/Chest: Effort normal and breath sounds normal. No respiratory distress.  Abdominal: Soft. There is no tenderness. There is no guarding.  Musculoskeletal: She exhibits no edema.  Normal motor function intact in all extremities and spine. No midline spinal tenderness.   Lymphadenopathy:    She has no cervical adenopathy.  Neurological: She is alert and oriented to person, place, and time.  Decreased muscle tone in the left facial muscles with smiling. Some relative paralysis noted on the left versus the right while smiling. Left upper and lower lid ptosis noted. Patient has difficulty trying to completely close the left eye. Furrowing the brow produces greater muscle tension on the right and is notably more relaxed on the left.  Sensation: No peripheral sensory deficits.   Cranial nerves III-XII otherwise grossly intact.   Strength 5/5 in all extremities.   No gait disturbance.   Coordination intact including heel to shin and finger to nose.   Skin: Skin is warm and dry. She is not diaphoretic.  Psychiatric: She has a normal mood and affect. Her behavior is normal.  Nursing note and vitals reviewed.    ED Treatments / Results  DIAGNOSTIC STUDIES: Oxygen Saturation is 100% on RA, normal by my interpretation.   COORDINATION OF CARE: 8:53 PM-Discussed next steps with pt. Pt  verbalized understanding and is agreeable with the plan.    Labs (all labs ordered are listed, but only abnormal results are displayed) Labs Reviewed - No data to display  EKG  EKG Interpretation None       Radiology No results found.  Procedures Procedures (including critical care time)  Medications Ordered in ED Medications  predniSONE (DELTASONE) tablet 60 mg (60 mg Oral Given 08/26/16 2129)  valACYclovir (VALTREX) tablet 1,000 mg (1,000 mg Oral Given 08/26/16 2129)     Initial Impression / Assessment and Plan / ED Course  I have reviewed the triage vital signs and the nursing notes.  Pertinent labs & imaging results that were available during my care of the patient were reviewed by me and considered in my medical decision making (see chart for details).     Patient presents with left-sided facial weakness consistent with Bell's palsy. Weakness includes muscles of the forehead. Patient instructed to follow up  with neurology. Return precautions discussed. Patient voices understanding of all instructions and is comfortable with discharge.   Findings and plan of care discussed with Geoffery Lyons, MD. Dr. Judd Lien personally evaluated and examined this patient.   Final Clinical Impressions(s) / ED Diagnoses   Final diagnoses:  Bell's palsy    New Prescriptions Discharge Medication List as of 08/26/2016  9:37 PM    START taking these medications   Details  predniSONE (DELTASONE) 10 MG tablet Take 3 tablets (30 mg total) by mouth 2 (two) times daily with a meal., Starting Fri 08/26/2016, Until Tue 08/30/2016, Print    predniSONE (STERAPRED UNI-PAK 21 TAB) 10 MG (21) TBPK tablet Beginning on day 5, Take 6 tabs on day 5, 5 tabs on day 6, 4 tabs on day 7, 3 tabs on day 8, 2 tabs on day 9, and 1 tab on day 10., Print    valACYclovir (VALTREX) 1000 MG tablet Take 1 tablet (1,000 mg total) by mouth 3 (three) times daily., Starting Fri 08/26/2016, Print        I personally  performed the services described in this documentation, which was scribed in my presence. The recorded information has been reviewed and is accurate.    Anselm Pancoast, PA-C 08/27/16 2116    Geoffery Lyons, MD 08/28/16 [phone]
